# Patient Record
Sex: Male | Born: 1985 | Race: Black or African American | Hispanic: No | Marital: Single | Smoking: Never smoker
Health system: Southern US, Community
[De-identification: ages and names within clinical notes are randomized; demographics above are authoritative.]

## PROBLEM LIST (undated history)

## (undated) DIAGNOSIS — J40 Bronchitis, not specified as acute or chronic: Secondary | ICD-10-CM

## (undated) DIAGNOSIS — J45909 Unspecified asthma, uncomplicated: Secondary | ICD-10-CM

---
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
## 2005-07-30 ENCOUNTER — Emergency Department (HOSPITAL_COMMUNITY): Admission: EM | Admit: 2005-07-30 | Discharge: 2005-07-30 | Payer: Self-pay | Admitting: Emergency Medicine

## 2006-05-07 ENCOUNTER — Emergency Department (HOSPITAL_COMMUNITY): Admission: EM | Admit: 2006-05-07 | Discharge: 2006-05-07 | Payer: Self-pay | Admitting: Emergency Medicine

## 2006-07-01 ENCOUNTER — Emergency Department (HOSPITAL_COMMUNITY): Admission: EM | Admit: 2006-07-01 | Discharge: 2006-07-01 | Payer: Self-pay | Admitting: Emergency Medicine

## 2006-09-18 ENCOUNTER — Emergency Department (HOSPITAL_COMMUNITY): Admission: EM | Admit: 2006-09-18 | Discharge: 2006-09-18 | Payer: Self-pay | Admitting: Emergency Medicine

## 2006-09-19 ENCOUNTER — Emergency Department (HOSPITAL_COMMUNITY): Admission: EM | Admit: 2006-09-19 | Discharge: 2006-09-19 | Payer: Self-pay | Admitting: Emergency Medicine

## 2008-08-20 ENCOUNTER — Emergency Department (HOSPITAL_COMMUNITY): Admission: EM | Admit: 2008-08-20 | Discharge: 2008-08-21 | Payer: Self-pay | Admitting: Emergency Medicine

## 2009-08-14 ENCOUNTER — Emergency Department (HOSPITAL_COMMUNITY): Admission: EM | Admit: 2009-08-14 | Discharge: 2009-08-14 | Payer: Self-pay | Admitting: Family Medicine

## 2010-11-19 ENCOUNTER — Inpatient Hospital Stay (INDEPENDENT_AMBULATORY_CARE_PROVIDER_SITE_OTHER)
Admission: RE | Admit: 2010-11-19 | Discharge: 2010-11-19 | Disposition: A | Discharge status: Home / Self Care | Payer: Self-pay | Source: Ambulatory Visit | Attending: Family Medicine | Admitting: Family Medicine

## 2010-11-19 DIAGNOSIS — K2970 Gastritis, unspecified, without bleeding: Secondary | ICD-10-CM

## 2010-11-19 LAB — POCT I-STAT, CHEM 8
BUN: 13 mg/dL (ref 6–23)
Calcium, Ion: 1.18 mmol/L (ref 1.12–1.32)
Chloride: 106 mEq/L (ref 96–112)
Creatinine, Ser: 0.8 mg/dL (ref 0.4–1.5)
Glucose, Bld: 90 mg/dL (ref 70–99)
HCT: 50 % (ref 39.0–52.0)
Hemoglobin: 17 g/dL (ref 13.0–17.0)
Potassium: 4.2 mEq/L (ref 3.5–5.1)
Sodium: 140 mEq/L (ref 135–145)
TCO2: 23 mmol/L (ref 0–100)

## 2010-11-19 LAB — POCT URINALYSIS DIPSTICK
Bilirubin Urine: NEGATIVE
Glucose, UA: NEGATIVE mg/dL
Hgb urine dipstick: NEGATIVE
Ketones, ur: NEGATIVE mg/dL
Nitrite: NEGATIVE
Protein, ur: NEGATIVE mg/dL
Specific Gravity, Urine: 1.025 (ref 1.005–1.030)
Urobilinogen, UA: 0.2 mg/dL (ref 0.0–1.0)
pH: 6 (ref 5.0–8.0)

## 2010-11-19 LAB — LIPASE, BLOOD: Lipase: 31 U/L (ref 11–59)

## 2010-11-19 LAB — OCCULT BLOOD, POC DEVICE: Fecal Occult Bld: NEGATIVE

## 2010-12-22 LAB — GC/CHLAMYDIA PROBE AMP, GENITAL: Chlamydia, DNA Probe: NEGATIVE

## 2011-06-12 ENCOUNTER — Inpatient Hospital Stay (INDEPENDENT_AMBULATORY_CARE_PROVIDER_SITE_OTHER)
Admission: RE | Admit: 2011-06-12 | Discharge: 2011-06-12 | Disposition: A | Discharge status: Home / Self Care | Payer: Self-pay | Source: Ambulatory Visit | Attending: Family Medicine | Admitting: Family Medicine

## 2011-06-12 DIAGNOSIS — J45909 Unspecified asthma, uncomplicated: Secondary | ICD-10-CM

## 2012-07-22 ENCOUNTER — Emergency Department (HOSPITAL_COMMUNITY)
Admission: EM | Admit: 2012-07-22 | Discharge: 2012-07-22 | Disposition: A | Discharge status: Home / Self Care | Payer: Self-pay | Attending: Emergency Medicine | Admitting: Emergency Medicine

## 2012-07-22 ENCOUNTER — Encounter (HOSPITAL_COMMUNITY): Payer: Self-pay

## 2012-07-22 ENCOUNTER — Emergency Department (HOSPITAL_COMMUNITY)
Admission: EM | Admit: 2012-07-22 | Discharge: 2012-07-22 | Discharge status: Against Medical Advice | Payer: Self-pay | Attending: Emergency Medicine | Admitting: Emergency Medicine

## 2012-07-22 DIAGNOSIS — R0989 Other specified symptoms and signs involving the circulatory and respiratory systems: Secondary | ICD-10-CM | POA: Insufficient documentation

## 2012-07-22 DIAGNOSIS — J40 Bronchitis, not specified as acute or chronic: Secondary | ICD-10-CM | POA: Insufficient documentation

## 2012-07-22 DIAGNOSIS — Z79899 Other long term (current) drug therapy: Secondary | ICD-10-CM | POA: Insufficient documentation

## 2012-07-22 DIAGNOSIS — R0609 Other forms of dyspnea: Secondary | ICD-10-CM | POA: Insufficient documentation

## 2012-07-22 DIAGNOSIS — J45909 Unspecified asthma, uncomplicated: Secondary | ICD-10-CM | POA: Insufficient documentation

## 2012-07-22 DIAGNOSIS — R059 Cough, unspecified: Secondary | ICD-10-CM | POA: Insufficient documentation

## 2012-07-22 DIAGNOSIS — R05 Cough: Secondary | ICD-10-CM | POA: Insufficient documentation

## 2012-07-22 HISTORY — DX: Bronchitis, not specified as acute or chronic: J40

## 2012-07-22 HISTORY — DX: Unspecified asthma, uncomplicated: J45.909

## 2012-07-22 MED ORDER — AZITHROMYCIN 250 MG PO TABS: 250.0000 mg | ORAL_TABLET | Freq: Every day | ORAL | Status: DC

## 2012-07-22 MED ORDER — PREDNISONE 20 MG PO TABS: 40.0000 mg | ORAL_TABLET | Freq: Every day | ORAL | Status: DC

## 2012-07-22 MED ORDER — ALBUTEROL SULFATE HFA 108 (90 BASE) MCG/ACT IN AERS
2.0000 | INHALATION_SPRAY | Freq: Once | RESPIRATORY_TRACT | Status: AC
  Administered 2012-07-22: 2 via RESPIRATORY_TRACT
  Filled 2012-07-22: qty 6.7

## 2012-07-22 NOTE — ED Provider Notes (Signed)
History     CSN: [id number]  Arrival date & time 07/22/12  4098   First MD Initiated Contact with Patient 07/22/12 [phone]      Chief Complaint  Patient presents with  . Shortness of Breath    (Consider location/radiation/quality/duration/timing/severity/associated sxs/prior treatment) HPI Comments: Pt with hx of bronchitis and asthma presents with 1 week of gradual onset, gradually worsening persitent SOB and wheezing and coughing - worse at night andd asociated with productive cough.  Dnies fevers, chills n/v/swelling / rashes.  He has used MDI until gone with minmal improvmenet.  Patient is a 26 y.o. male presenting with shortness of breath. The history is provided by the patient.  Shortness of Breath  Associated symptoms include cough, shortness of breath and wheezing. Pertinent negatives include no chest pain, no fever and no sore throat.    Past Medical History  Diagnosis Date  . Asthma   . Bronchitis     History reviewed. No pertinent past surgical history.  No family history on file.  History  Substance Use Topics  . Smoking status: Never Smoker   . Smokeless tobacco: Not on file  . Alcohol Use: No      Review of Systems  Constitutional: Negative for fever.  HENT: Negative for congestion, sore throat and neck stiffness.   Eyes: Negative for pain and visual disturbance.  Respiratory: Positive for cough, shortness of breath and wheezing.   Cardiovascular: Negative for chest pain.  Gastrointestinal: Negative for nausea.  Skin: Negative for rash.    Allergies  Review of patient's allergies indicates no known allergies.  Home Medications   Current Outpatient Rx  Name  Route  Sig  Dispense  Refill  . ALBUTEROL SULFATE HFA 108 (90 BASE) MCG/ACT IN AERS   Inhalation   Inhale 2-3 puffs into the lungs every 3 (three) hours as needed. Wheezing or shortness of breath         . AZITHROMYCIN 250 MG PO TABS   Oral   Take 1 tablet (250 mg total) by mouth daily.  500mg  PO day 1, then 250mg  PO days 205   6 tablet   0   . PREDNISONE 20 MG PO TABS   Oral   Take 2 tablets (40 mg total) by mouth daily.   10 tablet   0     BP 146/75  Pulse 76  Temp 98.8 F (37.1 C) (Oral)  Resp 18  SpO2 98%  Physical Exam  Nursing note and vitals reviewed. Constitutional: He appears well-developed and well-nourished. No distress.  HENT:  Head: Normocephalic.  Mouth/Throat: No oropharyngeal exudate.       OP clear without erythema, swelling or exudate  Eyes: Conjunctivae normal are normal. No scleral icterus.  Neck: Normal range of motion. Neck supple.  Cardiovascular: Normal rate, regular rhythm, normal heart sounds and intact distal pulses.   Pulmonary/Chest: Effort normal.       Mild expiratory wheezing, no distress, no increased WOB  Lymphadenopathy:    He has no cervical adenopathy.    ED Course  Procedures (including critical care time)  Labs Reviewed - No data to display No results found.   1. Bronchitis       MDM  Well appaering with bronchitis - no fever, persistent cough, mild wheeze,   Albuterol MDI Predniesone Ziothromax        Vida Roller, MD 07/22/12 [phone]

## 2012-07-22 NOTE — ED Notes (Signed)
Please refer to downtime documentaiton for traige.

## 2012-07-22 NOTE — ED Notes (Signed)
C/o sob onset 1 week ago states he normally uses his inhaler however he has run out. Difference tonight he has run out of inhalers . C/o cough,.

## 2012-07-22 NOTE — ED Notes (Signed)
Patient presents with shortness of breath x 1 week intermittently. Patient has history of asthma and reports his albuterol inhaler is not working.  No wheezing noted upon auscultation, patient speaking in full, complete sentences, no distress noted, patient saturation 98% on room air.

## 2014-05-20 ENCOUNTER — Emergency Department (HOSPITAL_COMMUNITY)
Admission: EM | Admit: 2014-05-20 | Discharge: 2014-05-20 | Disposition: A | Discharge status: Home / Self Care | Payer: Self-pay | Attending: Emergency Medicine | Admitting: Emergency Medicine

## 2014-05-20 ENCOUNTER — Encounter (HOSPITAL_COMMUNITY): Payer: Self-pay | Admitting: Emergency Medicine

## 2014-05-20 DIAGNOSIS — Z792 Long term (current) use of antibiotics: Secondary | ICD-10-CM | POA: Insufficient documentation

## 2014-05-20 DIAGNOSIS — J4521 Mild intermittent asthma with (acute) exacerbation: Secondary | ICD-10-CM

## 2014-05-20 DIAGNOSIS — Z79899 Other long term (current) drug therapy: Secondary | ICD-10-CM | POA: Insufficient documentation

## 2014-05-20 DIAGNOSIS — J45909 Unspecified asthma, uncomplicated: Secondary | ICD-10-CM | POA: Insufficient documentation

## 2014-05-20 DIAGNOSIS — J45901 Unspecified asthma with (acute) exacerbation: Secondary | ICD-10-CM | POA: Insufficient documentation

## 2014-05-20 MED ORDER — IPRATROPIUM-ALBUTEROL 0.5-2.5 (3) MG/3ML IN SOLN
3.0000 mL | Freq: Once | RESPIRATORY_TRACT | Status: AC
  Administered 2014-05-20: 3 mL via RESPIRATORY_TRACT
  Filled 2014-05-20: qty 3

## 2014-05-20 MED ORDER — PREDNISONE 20 MG PO TABS: 40.0000 mg | ORAL_TABLET | Freq: Every day | ORAL | Status: DC

## 2014-05-20 MED ORDER — ALBUTEROL SULFATE HFA 108 (90 BASE) MCG/ACT IN AERS
2.0000 | INHALATION_SPRAY | RESPIRATORY_TRACT | Status: DC | PRN
  Administered 2014-05-20: 2 via RESPIRATORY_TRACT
  Filled 2014-05-20: qty 6.7

## 2014-05-20 NOTE — Discharge Instructions (Signed)
Asthma Attack Prevention Although there is no way to prevent asthma from starting, you can take steps to control the disease and reduce its symptoms. Learn about your asthma and how to control it. Take an active role to control your asthma by working with your health care provider to create and follow an asthma action plan. An asthma action plan guides you in:  Taking your medicines properly.  Avoiding things that set off your asthma or make your asthma worse (asthma triggers).  Tracking your level of asthma control.  Responding to worsening asthma.  Seeking emergency care when needed. To track your asthma, keep records of your symptoms, check your peak flow number using a handheld device that shows how well air moves out of your lungs (peak flow meter), and get regular asthma checkups.  WHAT ARE SOME WAYS TO PREVENT AN ASTHMA ATTACK?  Take medicines as directed by your health care provider.  Keep track of your asthma symptoms and level of control.  With your health care provider, write a detailed plan for taking medicines and managing an asthma attack. Then be sure to follow your action plan. Asthma is an ongoing condition that needs regular monitoring and treatment.  Identify and avoid asthma triggers. Many outdoor allergens and irritants (such as pollen, mold, cold air, and air pollution) can trigger asthma attacks. Find out what your asthma triggers are and take steps to avoid them.  Monitor your breathing. Learn to recognize warning signs of an attack, such as coughing, wheezing, or shortness of breath. Your lung function may decrease before you notice any signs or symptoms, so regularly measure and record your peak airflow with a home peak flow meter.  Identify and treat attacks early. If you act quickly, you are less likely to have a severe attack. You will also need less medicine to control your symptoms. When your peak flow measurements decrease and alert you to an upcoming attack,  take your medicine as instructed and immediately stop any activity that may have triggered the attack. If your symptoms do not improve, get medical help.  Pay attention to increasing quick-relief inhaler use. If you find yourself relying on your quick-relief inhaler, your asthma is not under control. See your health care provider about adjusting your treatment. WHAT CAN MAKE MY SYMPTOMS WORSE? A number of common things can set off or make your asthma symptoms worse and cause temporary increased inflammation of your airways. Keep track of your asthma symptoms for several weeks, detailing all the environmental and emotional factors that are linked with your asthma. When you have an asthma attack, go back to your asthma diary to see which factor, or combination of factors, might have contributed to it. Once you know what these factors are, you can take steps to control many of them. If you have allergies and asthma, it is important to take asthma prevention steps at home. Minimizing contact with the substance to which you are allergic will help prevent an asthma attack. Some triggers and ways to avoid these triggers are: Animal Dander:  Some people are allergic to the flakes of skin or dried saliva from animals with fur or feathers.   There is no such thing as a hypoallergenic dog or cat breed. All dogs or cats can cause allergies, even if they don't shed.  Keep these pets out of your home.  If you are not able to keep a pet outdoors, keep the pet out of your bedroom and other sleeping areas at all  times, and keep the door closed. °· Remove carpets and furniture covered with cloth from your home. If that is not possible, keep the pet away from fabric-covered furniture and carpets. °Dust Mites: °Many people with asthma are allergic to dust mites. Dust mites are tiny bugs that are found in every home in mattresses, pillows, carpets, fabric-covered furniture, bedcovers, clothes, stuffed toys, and other  fabric-covered items.  °· Cover your mattress in a special dust-proof cover. °· Cover your pillow in a special dust-proof cover, or wash the pillow each week in hot water. Water must be hotter than 130° F (54.4° C) to kill dust mites. Cold or warm water used with detergent and bleach can also be effective. °· Wash the sheets and blankets on your bed each week in hot water. °· Try not to sleep or lie on cloth-covered cushions. °· Call ahead when traveling and ask for a smoke-free hotel room. Bring your own bedding and pillows in case the hotel only supplies feather pillows and down comforters, which may contain dust mites and cause asthma symptoms. °· Remove carpets from your bedroom and those laid on concrete, if you can. °· Keep stuffed toys out of the bed, or wash the toys weekly in hot water or cooler water with detergent and bleach. °Cockroaches: °Many people with asthma are allergic to the droppings and remains of cockroaches.  °· Keep food and garbage in closed containers. Never leave food out. °· Use poison baits, traps, powders, gels, or paste (for example, boric acid). °· If a spray is used to kill cockroaches, stay out of the room until the odor goes away. °Indoor Mold: °· Fix leaky faucets, pipes, or other sources of water that have mold around them. °· Clean floors and moldy surfaces with a fungicide or diluted bleach. °· Avoid using humidifiers, vaporizers, or swamp coolers. These can spread molds through the air. °Pollen and Outdoor Mold: °· When pollen or mold spore counts are high, try to keep your windows closed. °· Stay indoors with windows closed from late morning to afternoon. Pollen and some mold spore counts are highest at that time. °· Ask your health care provider whether you need to take anti-inflammatory medicine or increase your dose of the medicine before your allergy season starts. °Other Irritants to Avoid: °· Tobacco smoke is an irritant. If you smoke, ask your health care provider how  you can quit. Ask family members to quit smoking, too. Do not allow smoking in your home or car. °· If possible, do not use a wood-burning stove, kerosene heater, or fireplace. Minimize exposure to all sources of smoke, including incense, candles, fires, and fireworks. °· Try to stay away from strong odors and sprays, such as perfume, talcum powder, hair spray, and paints. °· Decrease humidity in your home and use an indoor air cleaning device. Reduce indoor humidity to below 60%. Dehumidifiers or central air conditioners can do this. °· Decrease house dust exposure by changing furnace and air cooler filters frequently. °· Try to have someone else vacuum for you once or twice a week. Stay out of rooms while they are being vacuumed and for a short while afterward. °· If you vacuum, use a dust mask from a hardware store, a double-layered or microfilter vacuum cleaner bag, or a vacuum cleaner with a HEPA filter. °· Sulfites in foods and beverages can be irritants. Do not drink beer or wine or eat dried fruit, processed potatoes, or shrimp if they cause asthma symptoms. °· Cold   air can trigger an asthma attack. Cover your nose and mouth with a scarf on cold or windy days.  Several health conditions can make asthma more difficult to manage, including a runny nose, sinus infections, reflux disease, psychological stress, and sleep apnea. Work with your health care provider to manage these conditions.  Avoid close contact with people who have a respiratory infection such as a cold or the flu, since your asthma symptoms may get worse if you catch the infection. Wash your hands thoroughly after touching items that may have been handled by people with a respiratory infection.  Get a flu shot every year to protect against the flu virus, which often makes asthma worse for days or weeks. Also get a pneumonia shot if you have not previously had one. Unlike the flu shot, the pneumonia shot does not need to be given  yearly. Medicines:  Talk to your health care provider about whether it is safe for you to take aspirin or non-steroidal anti-inflammatory medicines (NSAIDs). In a small number of people with asthma, aspirin and NSAIDs can cause asthma attacks. These medicines must be avoided by people who have known aspirin-sensitive asthma. It is important that people with aspirin-sensitive asthma read labels of all over-the-counter medicines used to treat pain, colds, coughs, and fever.  Beta-blockers and ACE inhibitors are other medicines you should discuss with your health care provider. HOW CAN I FIND OUT WHAT I AM ALLERGIC TO? Ask your asthma health care provider about allergy skin testing or blood testing (the RAST test) to identify the allergens to which you are sensitive. If you are found to have allergies, the most important thing to do is to try to avoid exposure to any allergens that you are sensitive to as much as possible. Other treatments for allergies, such as medicines and allergy shots (immunotherapy) are available.  CAN I EXERCISE? Follow your health care provider's advice regarding asthma treatment before exercising. It is important to maintain a regular exercise program, but vigorous exercise or exercise in cold, humid, or dry environments can cause asthma attacks, especially for those people who have exercise-induced asthma. Document Released: 08/24/2009 Document Revised: 09/10/2013 Document Reviewed: 03/13/2013 [facility] Patient Information 2015 [place]. This information is not intended to replace advice given to you by your health care provider. Make sure you discuss any questions you have with your health care provider.   Emergency Department Resource Guide 1) Find a Doctor and Pay Out of Pocket Although you won't have to find out who is covered by your insurance plan, it is a good idea to ask around and get recommendations. You will then need to call the office and see if the doctor  you have chosen will accept you as a new patient and what types of options they offer for patients who are self-pay. Some doctors offer discounts or will set up payment plans for their patients who do not have insurance, but you will need to ask so you aren't surprised when you get to your appointment.  2) Contact Your Local Health Department Not all health departments have doctors that can see patients for sick visits, but many do, so it is worth a call to see if yours does. If you don't know where your local health department is, you can check in your phone book. The CDC also has a tool to help you locate your state's health department, and many state websites also have listings of all of their local health departments.  3) Find  a Walk-in Clinic If your illness is not likely to be very severe or complicated, you may want to try a walk in clinic. These are popping up all over the country in pharmacies, drugstores, and shopping centers. They're usually staffed by nurse practitioners or physician assistants that have been trained to treat common illnesses and complaints. They're usually fairly quick and inexpensive. However, if you have serious medical issues or chronic medical problems, these are probably not your best option.  No Primary Care Doctor: - Call Health Connect at  [phone] - they can help you locate a primary care doctor that  accepts your insurance, provides certain services, etc. - Physician Referral Service- [phone]  Chronic Pain Problems: Organization         Address  Phone   Notes  [facility]  [phone] Patients need to be referred by their primary care doctor.   Medication Assistance: Organization         Address  Phone   Notes  [facility]ication [facility] [address] [place], [place] [id number] [phone] --Must be a resident of [facility] -- Must have NO insurance coverage whatsoever (no Medicaid/  Medicare, etc.) -- The pt. MUST have a primary care doctor that directs their care regularly and follows them in the community   MedAssist  [phone]   [employer]  [phone]    Agencies that provide inexpensive medical care: Organization         Address  Phone   Notes  [facility]  [phone]   [facility]    [phone]   [facility] [address] [place], [place] [address] [phone]   [facility] [address], [place] [phone]   [facility]anned Parenthood    [phone]   [facility]    [phone]   [facility]  [address], [place] Phone:  [phone], Fax:  [phone] Hours of Operation:  9 am - 6 pm, M-F.  Also accepts Medicaid/Medicare and self-pay.  [facility]  [address], [place] Phone: [phone], Fax: [phone]. Hours of Operation:  8:30 am - 5:30 pm, M-F.  Also accepts Medicaid and self-pay.  [facility] [place] [address], [place] Phone: [phone]   [facility] [address], [place], [place] [phone], Ext. [phone] Mondays & Thursdays: 7-9 AM.  First 15 patients are seen on a first come, first serve basis.    Medicaid-accepting [facility] Providers:  Organization         Address  Phone   Notes  [facility] [address], [place] [phone] Also accepts self-pay patients.  [facility] [address] [place], [place]  [phone]   [facility] [address], [place] [phone]   [facility] [address] [place] [address], [place] [phone]   Renaye RakersVeita Bland [address], [place]   [phone] Only accepts [place] Access [place] patients after they have their name applied to their card.    Self-Pay (no insurance) in [facility]:  Organization         Address  Phone   Notes  Sickle Cell Patients, [facility] [address]  [place], [place] [phone]   [facility] [address] [place], [place] [phone]   [facility] [place]  [address], [place] [phone]   [facility]/Dr. Osei-Bonsu  [address], [place] or [address], [place] [phone] Phone number for both [place] and [place] locations is the same.  [facility] [address], [place] [phone]   [facility] [address] [place] or [address] [phone]   [facility] [address], [place] [phone], phone; [phone], fax Sees patients 1st and 3rd Saturday of every month.  Must not qualify for public or private insurance (i.e. Medicaid, Medicare, [place] Health Choice, Veterans' Benefits)  Household income should be no more than 200% of the poverty level The clinic cannot treat you if you are pregnant or think you are pregnant  Sexually transmitted diseases are not treated at the clinic.    Dental Care: Organization         Address  Phone  Notes  [facility] Department of [facility] [address] [place], [place] [phone] Accepts children up to age 82 who are enrolled in [place] or [place] Health Choice; pregnant women with a Medicaid card; and children who have applied for Medicaid or [place] Health Choice, but were declined, whose parents can pay a reduced fee at time of service.  [facility] Department of [facility]  [address], [place] [phone] Accepts children up to age 62 who are enrolled in [place] or [place] Health Choice; pregnant women with a Medicaid card; and children who have applied for Medicaid or [place] Health Choice,  but were declined, whose parents can pay a reduced fee at time of service.  [facility]  [address] [place], [place] [phone] Patients are seen by appointment only. Walk-ins are not accepted. [facility] will see patients 38 years of age and older. Monday - Tuesday (8am-5pm) Most Wednesdays (8:30-5pm) $30 per visit, cash only  [facility] Adult Dental Access PROGRAM  [address], [facility] [phone] Patients are seen by appointment only. Walk-ins are not accepted. [facility] will see patients 67 years of age and older. One Wednesday Evening (Monthly: Volunteer Based).  $30 per visit, cash only  [employer]  [phone] for adults; Children under age 89, call Graduate Pediatric Dentistry at [phone]. Children aged 32-14, please call [phone] to request a pediatric application.  Dental services are provided in all areas of dental care including fillings, crowns and bridges, complete and partial dentures, implants, gum treatment, root canals, and extractions. Preventive care is also provided. Treatment is provided to both adults and children. Patients are selected via a lottery and there is often a waiting list.   [facility] [address] [place]  [phone] [email or web address]   [facility] [address] [place], [place] [phone], Ext. [phone] Second and Fourth Thursday of each month, opens at 6:30 AM; Clinic ends at 9 AM.  Patients are seen on a first-come first-served basis, and a limited number are seen during each clinic.   [facility]  [address] [place], [place] [phone]   Eligibility Requirements You must have lived in [place], [place], or [place]  counties for at least the last three months.   You cannot be eligible for state or federal sponsored [place], including [employer], [place], or [employer].   You generally  cannot be eligible for healthcare insurance through your employer.    How to apply: Eligibility screenings are held every Tuesday and Wednesday afternoon from 1:00 pm until 4:00 pm. You do not need an appointment for the interview!  [facility] [address], [place], [place] [phone]   [facility]  [phone]   [facility] Health Department  [phone]   [facility] Health Department  [phone]    Behavioral Health Resources in the Community: Intensive Outpatient Programs Organization         Address  Phone  Notes  [facility] [address], [place], [place] [phone]   [facility] Outpatient [address], [place], [place] [phone]   [facility] [address], [place], [place]  [phone]   [facility] Mental Health [address]  [place], [place] [phone] or [phone]   Substance Abuse Resources Organization         Address  Phone  Notes  [facility] Drug Services  [phone]   [facility]  [phone]   [facility] [place]  [phone]   Floydene Flock  [phone]   Residential & Outpatient Substance Abuse Program  [phone]   Psychological Services Organization         Address  Phone  Notes  [facility]  [phone]   [facility]  [phone]   [facility] [address] [place] [phone] or [phone]    Mobile Crisis Teams Organization         Address  Phone  Notes  [facility], Mobile Crisis Care Unit  [phone]   [facility]  [address] [place], [place] [phone]   Doristine Locks [address] [place] [phone]    Self-Help/Support Groups Organization         Address  Phone             Notes  [facility] [place] - variety of support groups  [phone]- I7437963 Call for more  information  Narcotics Anonymous (NA), [facility] [address], [employer] [place]  2 meetings at this location   Statistician         Address  Phone  Notes  [facility] [address],    [place]  [phone]   [facility]  [address] [place] [address], [place], [place] [phone]   [facility] [address] [place], [place] [phone] Admissions: 8am-3pm M-F  [facility] [address],    [place], [place] [phone]   [facility] [address] [place], [place], [place] [phone]   [facility] [address],  [place], [place] [phone]   [facility] - Intensive Outpatient [address], [place], [place] [phone]   [facility] [address] [place]  [place], [place] [phone] or [phone]   [facility] ([facility]) [address] [place], [place] [phone] Accepts Medicaid  [facility] [address]  [place] [phone] Substance Abuse/Addiction Treatment   [facility] Resources Organization         Address  Phone  Notes  Estate manager/land agent Services  (  [phone]   Angie Fava, PhD [address] [place], [place]   [phone] or [phone]   [facility] Behavioral   [address] [place], [place] [phone]   [facility] Recovery [address], [place], [place] [phone] Insurance/Medicaid/sponsorship through [facility] [address]                                    [place], [place] [phone] Therapy/tele-psych/case  [facility] [address], [place] [phone]    Dr. Lolly Mustache  [phone]   [facility]  [employer] [facility] Dept. 1) [address], [place] 2) [address], [place] 3)  [address], [place] [phone]  [phone]   [facility] Child Abuse Hotline (787) 103-5261 or [phone] (After Hours)

## 2014-05-20 NOTE — ED Provider Notes (Signed)
CSN: [id number]     Arrival date & time 05/20/14  1458 History  This chart was scribed for non-physician practitioner, Fayrene Helper, PA-C working with Doug Sou, MD by Luisa Dago, ED scribe. This patient was seen in room TR08C/TR08C and the patient's care was started at 4:52 PM.    Chief Complaint  Patient presents with  . Asthma   The history is provided by the patient. No language interpreter was used.   HPI Comments: Justin Villegas is a 28 y.o. male with a hx of asthma presents to the Emergency Department requesting a medication refill on for his  Albuterol inhaler. Pt states that he ran out of his medication about a month ago and has been using his niece's inhaler on and off. Pt states that this morning he started wheezing so he decided to come in for a refill. Justin Villegas does not have a PCP. He states that he can go 8 months without any symptoms. He is also complaining of associated rhinorrhea and sneezing. No hx of intubations or ICU stays. Denies fever, chills, or sore throat.   Past Medical History  Diagnosis Date  . Asthma   . Bronchitis    History reviewed. No pertinent past surgical history. No family history on file. History  Substance Use Topics  . Smoking status: Never Smoker   . Smokeless tobacco: Not on file  . Alcohol Use: No    Review of Systems  Constitutional: Negative for fever.  Eyes: Negative for visual disturbance.  Respiratory: Positive for wheezing. Negative for shortness of breath.   Cardiovascular: Negative for chest pain, palpitations and leg swelling.  Gastrointestinal: Negative for nausea, vomiting and abdominal pain.  Neurological: Negative for dizziness, light-headedness and headaches.      Allergies  Review of patient's allergies indicates no known allergies.  Home Medications   Prior to Admission medications   Medication Sig Start Date End Date Taking? Authorizing Provider  albuterol (PROVENTIL HFA;VENTOLIN HFA) 108 (90 BASE)  MCG/ACT inhaler Inhale 2-3 puffs into the lungs every 3 (three) hours as needed. Wheezing or shortness of breath    Historical Provider, MD  azithromycin (ZITHROMAX Z-PAK) 250 MG tablet Take 1 tablet (250 mg total) by mouth daily.  PO day 1, then  PO days 205 07/22/12   Vida Roller, MD  predniSONE (DELTASONE) 20 MG tablet Take 2 tablets (40 mg total) by mouth daily. 07/22/12   Vida Roller, MD   BP 146/75  Pulse 86  Temp(Src) 98 F (36.7 C) (Oral)  Resp 20  SpO2 98%  Physical Exam  Nursing note and vitals reviewed. Constitutional: He is oriented to person, place, and time. He appears well-developed and well-nourished. No distress.  HENT:  Head: Normocephalic and atraumatic.  Right Ear: External ear normal.  Left Ear: External ear normal.  Nose: Nose normal.  Mouth/Throat: Oropharynx is clear and moist. No oropharyngeal exudate.  Eyes: Conjunctivae and EOM are normal. Pupils are equal, round, and reactive to light.  Neck: Normal range of motion. Neck supple. No thyromegaly present.  Cardiovascular: Normal rate, regular rhythm and normal heart sounds.  Exam reveals no gallop and no friction rub.   No murmur heard. Pulmonary/Chest: Effort normal and breath sounds normal. No respiratory distress. He has no wheezes.  Musculoskeletal: Normal range of motion.  Lymphadenopathy:    He has no cervical adenopathy.  Neurological: He is alert and oriented to person, place, and time.  Skin: Skin is warm and dry.  Psychiatric:  He has a normal mood and affect. His behavior is normal.    ED Course  Procedures (including critical care time)  DIAGNOSTIC STUDIES: Oxygen Saturation is 98% on RA, normal by my interpretation.    COORDINATION OF CARE: 4:59 PM- able to ambulate while maintaning adequate O2.  Stable for discharge.  No active wheezing.  sxs likely trigger due to URI.  Steroid course provided.  Rescue inhaler given.  Pt advised of plan for treatment and pt agrees.  Labs  Review Labs Reviewed - No data to display  Imaging Review No results found.   EKG Interpretation None      MDM   Final diagnoses:  Asthma exacerbation attacks, mild intermittent   BP 146/75  Pulse 86  Temp(Src) 98 F (36.7 C) (Oral)  Resp 20  SpO2 98%   I personally performed the services described in this documentation, which was scribed in my presence. The recorded information has been reviewed and is accurate.    Fayrene Helper, PA-C 05/20/14 1715

## 2014-05-20 NOTE — ED Notes (Signed)
Pt reports he has been out of inhaler x months. Pt states today he started noticing wheezing while working today. Denies SOB, pt speaks in complete sentences, NAD. Lungs diminished in bases bilaterally, exp wheezing noted to right lower lung. NAD.

## 2014-05-21 NOTE — ED Provider Notes (Signed)
Medical screening examination/treatment/procedure(s) were performed by non-physician practitioner and as supervising physician I was immediately available for consultation/collaboration.   EKG Interpretation None       Doug Sou, MD 05/21/14 [phone]

## 2014-10-27 ENCOUNTER — Ambulatory Visit: Payer: Self-pay | Attending: Internal Medicine | Admitting: Internal Medicine

## 2014-10-27 ENCOUNTER — Encounter: Payer: Self-pay | Admitting: Internal Medicine

## 2014-10-27 VITALS — BP 125/78 | HR 64 | Temp 98.8°F | Resp 16 | Ht 73.0 in | Wt 319.0 lb

## 2014-10-27 DIAGNOSIS — Z23 Encounter for immunization: Secondary | ICD-10-CM

## 2014-10-27 DIAGNOSIS — Z7952 Long term (current) use of systemic steroids: Secondary | ICD-10-CM | POA: Insufficient documentation

## 2014-10-27 DIAGNOSIS — Z833 Family history of diabetes mellitus: Secondary | ICD-10-CM

## 2014-10-27 DIAGNOSIS — Z79899 Other long term (current) drug therapy: Secondary | ICD-10-CM | POA: Insufficient documentation

## 2014-10-27 DIAGNOSIS — E669 Obesity, unspecified: Secondary | ICD-10-CM

## 2014-10-27 DIAGNOSIS — J4520 Mild intermittent asthma, uncomplicated: Secondary | ICD-10-CM

## 2014-10-27 LAB — GLUCOSE, POCT (MANUAL RESULT ENTRY): POC GLUCOSE: 92 mg/dL (ref 70–99)

## 2014-10-27 MED ORDER — ALBUTEROL SULFATE HFA 108 (90 BASE) MCG/ACT IN AERS: 2.0000 | INHALATION_SPRAY | RESPIRATORY_TRACT | Status: AC | PRN

## 2014-10-27 NOTE — Progress Notes (Signed)
Patient ID: Justin Villegas, male   DOB: 1986-07-04, 29 y.o.   MRN: [id number]  [id number]  [id number]  DOB - 1986-07-04  CC:  Chief Complaint  Patient presents with  . Establish Care       HPI: Justin Villegas is a 29 y.o. male here today to establish medical care. He has a past medical history of asthma.  He presents today for inhaler refills.  He reports that he has only been on albuterol in the past.  He has been out of his inhaler for the past 6 months.  He now has wheezing, SOB, and nighttime SOB once every two weeks.  He denies tobacco, drug, and alcohol use.  Patient reports that he has several family members that are diabetic and he would like testing.    No Known Allergies Past Medical History  Diagnosis Date  . Asthma   . Bronchitis    Current Outpatient Prescriptions on File Prior to Visit  Medication Sig Dispense Refill  . albuterol (PROVENTIL HFA;VENTOLIN HFA) 108 (90 BASE) MCG/ACT inhaler Inhale 2-3 puffs into the lungs every 3 (three) hours as needed. Wheezing or shortness of breath    . azithromycin (ZITHROMAX Z-PAK) 250 MG tablet Take 1 tablet (250 mg total) by mouth daily. 500mg  PO day 1, then 250mg  PO days 205 (Patient not taking: Reported on 10/27/2014) 6 tablet 0  . predniSONE (DELTASONE) 20 MG tablet Take 2 tablets (40 mg total) by mouth daily. (Patient not taking: Reported on 10/27/2014) 10 tablet 0   No current facility-administered medications on file prior to visit.   History reviewed. No pertinent family history. History   Social History  . Marital Status: Single    Spouse Name: N/A    Number of Children: N/A  . Years of Education: N/A   Occupational History  . Not on file.   Social History Main Topics  . Smoking status: Never Smoker   . Smokeless tobacco: Not on file  . Alcohol Use: No  . Drug Use: No  . Sexual Activity: Not on file   Other Topics Concern  . Not on file   Social History Narrative    Review of Systems  Respiratory:  Positive for shortness of breath and wheezing.   All other systems reviewed and are negative.     Objective:   Filed Vitals:   10/27/14 0915  BP: 125/78  Pulse: 64  Temp: 98.8 F (37.1 C)  Resp: 16    Physical Exam  Constitutional: He is oriented to person, place, and time.  Cardiovascular: Normal rate, regular rhythm and normal heart sounds.   Pulmonary/Chest: Effort normal and breath sounds normal.  Abdominal: Soft. Bowel sounds are normal.  Musculoskeletal: He exhibits no edema.  Neurological: He is alert and oriented to person, place, and time.  Skin: Skin is warm and dry.     Lab Results  Component Value Date   HGB 17.0 11/19/2010   HCT 50.0 11/19/2010   Lab Results  Component Value Date   CREATININE 0.8 11/19/2010   BUN 13 11/19/2010   NA 140 11/19/2010   K 4.2 11/19/2010   CL 106 11/19/2010    No results found for: HGBA1C Lipid Panel  No results found for: CHOL, TRIG, HDL, CHOLHDL, VLDL, LDLCALC     Assessment and plan:   Justin Villegas was seen today for establish care.  Diagnoses and associated orders for this visit:  Asthma, mild intermittent, uncomplicated - albuterol (PROVENTIL HFA;VENTOLIN HFA) 108 (90 BASE)  MCG/ACT inhaler; Inhale 2 puffs into the lungs every 4 (four) hours as needed. Wheezing or shortness of breath  Obesity Weight loss discussed at length and its complications to health.  Patient will loss 10 months by next visit in 3 months.  Diet and exercise discussed as well as calorie intake.  Family history of diabetes mellitus - POCT glucose (manual entry) - POCT glycosylated hemoglobin (Hb A1C)  Need for prophylactic vaccination and inoculation against influenza - Flu Vaccine QUAD 36+ mos IM   Return if symptoms worsen or fail to improve.    Holland Commons, NP-C [facility] [phone] 10/27/2014, 9:24 AM

## 2014-10-27 NOTE — Progress Notes (Signed)
Pt is here today to establish care. Pt has a history of asthma. Pt would like an inhaler.

## 2014-10-27 NOTE — Patient Instructions (Signed)

## 2016-01-26 ENCOUNTER — Ambulatory Visit: Payer: Self-pay | Admitting: Internal Medicine

## 2017-01-09 ENCOUNTER — Emergency Department (HOSPITAL_COMMUNITY): Payer: Self-pay

## 2017-01-09 ENCOUNTER — Encounter (HOSPITAL_COMMUNITY): Payer: Self-pay

## 2017-01-09 ENCOUNTER — Emergency Department (HOSPITAL_COMMUNITY)
Admission: EM | Admit: 2017-01-09 | Discharge: 2017-01-09 | Disposition: A | Discharge status: Home / Self Care | Payer: Self-pay | Attending: Emergency Medicine | Admitting: Emergency Medicine

## 2017-01-09 DIAGNOSIS — J45909 Unspecified asthma, uncomplicated: Secondary | ICD-10-CM | POA: Insufficient documentation

## 2017-01-09 DIAGNOSIS — W228XXA Striking against or struck by other objects, initial encounter: Secondary | ICD-10-CM | POA: Insufficient documentation

## 2017-01-09 DIAGNOSIS — Y929 Unspecified place or not applicable: Secondary | ICD-10-CM | POA: Insufficient documentation

## 2017-01-09 DIAGNOSIS — Y999 Unspecified external cause status: Secondary | ICD-10-CM | POA: Insufficient documentation

## 2017-01-09 DIAGNOSIS — S62346A Nondisplaced fracture of base of fifth metacarpal bone, right hand, initial encounter for closed fracture: Secondary | ICD-10-CM | POA: Insufficient documentation

## 2017-01-09 DIAGNOSIS — Y9389 Activity, other specified: Secondary | ICD-10-CM | POA: Insufficient documentation

## 2017-01-09 DIAGNOSIS — S62306A Unspecified fracture of fifth metacarpal bone, right hand, initial encounter for closed fracture: Secondary | ICD-10-CM

## 2017-01-09 NOTE — Progress Notes (Signed)
Orthopedic Tech Progress Note Patient Details:  Justin Villegas 11/08/1985 [id number]  Ortho Devices Type of Ortho Device: Ulna gutter splint, Arm sling Ortho Device/Splint Location: applied ulna gutter splint (short arm splint) to pt right hand/arm.  Pt tolerated well.  Provided and applied arm sling to pt right arm for support.  Right hand/arm.  Ortho Device/Splint Interventions: Application, Adjustment   Alvina Chou 01/09/2017, 11:40 AM

## 2017-01-09 NOTE — ED Provider Notes (Signed)
[facility]-EMERGENCY DEPT Provider Note   CSN: [id number] Arrival date & [id number]/23/18  4098  By signing my name below, I, Marnette Burgess Long, attest that this documentation has been prepared under the direction and in the presence of Ponciano Ort [place], [place]. Electronically Signed: Marnette Burgess Long, Scribe. 01/09/2017. 10:26 AM.   History   Chief Complaint Chief Complaint  Patient presents with  . Hand Injury   The history is provided by the patient and medical records. No language interpreter was used.    HPI Comments:  Justin Villegas is a 31 y.o. male with a PMHx of Asthma and Bronchitis, who presents to the Emergency Department complaining of sudden onset, constant, 8/10 right hand pain beginning two days ago. Pt reports "playing around" two mornings ago, when he struck the right hand on "something". Pt has an associated symptom of swelling to the area. He has tried Ibuprofen at home with minimal relief. He states direct pressure and palpation exacerbate his pain. Pt denies fever and any other injuries at this time.   Past Medical History:  Diagnosis Date  . Asthma   . Bronchitis    There are no active problems to display for this patient.  History reviewed. No pertinent surgical history.  Home Medications    Prior to Admission medications   Medication Sig Start Date End Date Taking? Authorizing Provider  albuterol (PROVENTIL HFA;VENTOLIN HFA) 108 (90 BASE) MCG/ACT inhaler Inhale 2 puffs into the lungs every 4 (four) hours as needed. Wheezing or shortness of breath 10/27/14   Ambrose Finland, NP   Family History History reviewed. No pertinent family history.  Social History Social History  Substance Use Topics  . Smoking status: Never Smoker  . Smokeless tobacco: Never Used  . Alcohol use No   Allergies   Patient has no known allergies.   Review of Systems Review of Systems  Constitutional: Negative for fever.  Musculoskeletal: Positive for joint swelling and myalgias.    All other systems reviewed and are negative.   Physical Exam Updated Vital Signs BP (!) 154/84 (BP Location: Left Arm)   Pulse 71   Temp 98 F (36.7 C) (Oral)   Resp 19   SpO2 96%   Physical Exam  Constitutional: He is oriented to person, place, and time. He appears well-developed and well-nourished.  HENT:  Head: Normocephalic.  Eyes: Conjunctivae are normal.  Cardiovascular: Normal rate.   Pulmonary/Chest: Effort normal.  Abdominal: He exhibits no distension.  Musculoskeletal: Normal range of motion.  Swollen, tender over his lateral right hand below the pinkie. Decreased ROM, neurovascularly and sensory intact   Neurological: He is alert and oriented to person, place, and time.  Skin: Skin is warm and dry.  Psychiatric: He has a normal mood and affect.  Nursing note and vitals reviewed.   ED Treatments / Results  DIAGNOSTIC STUDIES:  Oxygen Saturation is 96% on RA, adequate by my interpretation.    COORDINATION OF CARE:  10:24 AM Discussed treatment plan with pt at bedside including XR of right hand with a splint and pt agreed to plan.  Labs (all labs ordered are listed, but only abnormal results are displayed) Labs Reviewed - No data to display  EKG  EKG Interpretation None       Radiology Dg Hand Complete Right  Result Date: 01/09/2017 CLINICAL DATA:  Pain after hitting a wall EXAM: RIGHT HAND - COMPLETE 3+ VIEW COMPARISON:  None. FINDINGS: Frontal, oblique, and lateral views were obtained. There  is a fracture of the distal fifth metacarpal with volar angulation distally. No other fracture. There is soft tissue swelling medially. There is no dislocation. Joint spaces appear within normal limits. No erosive change. IMPRESSION: Soft tissue swelling, most marked medially. Fracture distal fifth metacarpal with volar angulation distally. No other fracture. No dislocation. No evident arthropathy. Electronically Signed   By: Bretta Bang III M.D.   On:  01/09/2017 10:00    Procedures Procedures (including critical care time)  Medications Ordered in ED Medications - No data to display   Initial Impression / Assessment and Plan / ED Course  I have reviewed the triage vital signs and the nursing notes.  Pertinent labs & imaging results that were available during my care of the patient were reviewed by me and considered in my medical decision making (see chart for details).      Patient X-Ray Impression: Soft tissue swelling, most marked medially. Fracture distal fifth metacarpal with volar angulation distally. No other fracture. No dislocation. No evident arthropathy.  Pt advised to follow up with orthopedics. Patient given splint while in ED, conservative therapy recommended and discussed. Patient will be discharged home & is agreeable with above plan. Returns precautions discussed. Pt appears safe for discharge.   Final Clinical Impressions(s) / ED Diagnoses   Final diagnoses:  Closed nondisplaced fracture of fifth metacarpal bone of right hand, unspecified portion of metacarpal, initial encounter    New Prescriptions Discharge Medication List as of 01/09/2017 10:55 AM      An After Visit Summary was printed and given to the patient.  I personally performed the services in this documentation, which was scribed in my presence.  The recorded information has been reviewed and considered.   Barnet Pall.     Lonia Skinner [place], PA-C 01/09/17 1526    Gerhard Munch, MD 01/12/17 [phone]

## 2017-01-09 NOTE — ED Triage Notes (Signed)
Pt states he was playing around on Saturday and hurt his right hand. Swelling noted. Limited ROM. Strong radial pulse noted.

## 2017-01-09 NOTE — Discharge Instructions (Signed)
Return if any problems.

## 2018-05-09 ENCOUNTER — Encounter (HOSPITAL_COMMUNITY): Payer: Self-pay | Admitting: *Deleted

## 2018-05-09 ENCOUNTER — Emergency Department (HOSPITAL_COMMUNITY): Payer: Self-pay

## 2018-05-09 ENCOUNTER — Emergency Department (HOSPITAL_COMMUNITY)
Admission: EM | Admit: 2018-05-09 | Discharge: 2018-05-09 | Discharge status: Against Medical Advice | Payer: Self-pay | Attending: Emergency Medicine | Admitting: Emergency Medicine

## 2018-05-09 DIAGNOSIS — Z5320 Procedure and treatment not carried out because of patient's decision for unspecified reasons: Secondary | ICD-10-CM

## 2018-05-09 DIAGNOSIS — Z79899 Other long term (current) drug therapy: Secondary | ICD-10-CM | POA: Insufficient documentation

## 2018-05-09 DIAGNOSIS — R51 Headache: Secondary | ICD-10-CM | POA: Insufficient documentation

## 2018-05-09 DIAGNOSIS — R519 Headache, unspecified: Secondary | ICD-10-CM

## 2018-05-09 DIAGNOSIS — J45909 Unspecified asthma, uncomplicated: Secondary | ICD-10-CM | POA: Insufficient documentation

## 2018-05-09 DIAGNOSIS — Z5329 Procedure and treatment not carried out because of patient's decision for other reasons: Secondary | ICD-10-CM

## 2018-05-09 NOTE — ED Triage Notes (Signed)
Pt in c/o sudden headache that felt like something was stabbing him last night, since that time pain has lessened but states he doesn't feel right

## 2018-05-09 NOTE — Discharge Instructions (Signed)
You were seen here today for headache.  Your CT scan was reassuring.  I discussed with you I have a concern for a bleed in your brain and you would need to be evaluated by a lumbar puncture as the next step. You declined  this. You were offered further imaging of your head with a different type of CT scan as well but declined this test at this time.  Leaving against medical advice can lead to but is not limited to, death, permanent disability, or severe pain. Please return as soon as possible for further evaluation.

## 2018-05-09 NOTE — ED Provider Notes (Signed)
[facility] Provider Note   CSN: [id number] Arrival date & time: 05/09/18  1116     History   Chief Complaint Chief Complaint  Patient presents with  . Headache    HPI Justin Villegas is a 32 y.o. male with no significant past medical history presents emergency department today for headache.  Patient reports he was sitting on his counts yesterday watching TV when he had the sudden onset of a sharp, stabbing headache in his right temple that he rates as a 15/10.  Towards the pain lasted as the most severe pain of his life for approximately 5 minutes before becoming slightly better.  He reports that the pain persisted throughout the night despite Advil and BC powder.  He reports upon awakening his pain had improved and was intermittent throughout the day.  He reports that after waiting in the waiting room his pain has now subsided.  He denies any head trauma, neck pain, visual changes, diplopia, facial droop, numbness/tingling/weakness of the extremities, photophobia, phonophobia, nausea/vomiting, tinnitus, difficulty with speech, vertigo, chest pain, shortness of breath, or other symptoms related to this.  He denies any cocaine or IV drug use.  He denies prior history of headaches.  No personal or family history of migraines.  He denies jaw claudication.  He denies anyone with similar symptoms.   HPI  Past Medical History:  Diagnosis Date  . Asthma   . Bronchitis     There are no active problems to display for this patient.   History reviewed. No pertinent surgical history.      Home Medications    Prior to Admission medications   Medication Sig Start Date End Date Taking? Authorizing Provider  albuterol (PROVENTIL HFA;VENTOLIN HFA) 108 (90 BASE) MCG/ACT inhaler Inhale 2 puffs into the lungs every 4 (four) hours as needed. Wheezing or shortness of breath 10/27/14  Yes Ambrose FinlandKeck, Valerie A, NP  Aspirin-Salicylamide-Caffeine (BC FAST PAIN RELIEF)  650-195-33.3 MG PACK Take 1 packet by mouth 2 (two) times daily as needed (pain).   Yes [provider]  ibuprofen (ADVIL,MOTRIN) 200 MG tablet Take 400 mg by mouth every 6 (six) hours as needed for headache.   Yes [provider]    Family History History reviewed. No pertinent family history.  Social History Social History   Tobacco Use  . Smoking status: Never Smoker  . Smokeless tobacco: Never Used  Substance Use Topics  . Alcohol use: No  . Drug use: No     Allergies   Patient has no known allergies.   Review of Systems Review of Systems  All other systems reviewed and are negative.    Physical Exam Updated Vital Signs BP (!) 162/94   Pulse (!) 59   Temp 98 F (36.7 C) (Oral)   Resp 16   SpO2 100%   Physical Exam  Constitutional: He is oriented to person, place, and time. He appears well-developed and well-nourished.  HENT:  Head: Normocephalic and atraumatic.  Right Ear: External ear normal.  Left Ear: External ear normal.  Nose: Nose normal.  Mouth/Throat: Uvula is midline, oropharynx is clear and moist and mucous membranes are normal. No tonsillar exudate.  No scalp TTP  Eyes: Pupils are equal, round, and reactive to light. Right eye exhibits no discharge. Left eye exhibits no discharge. No scleral icterus.  Neck: Trachea normal. Neck supple. No spinous process tenderness present. No neck rigidity. Normal range of motion present.  No nuchal rigidity or  meningismus. No carotid bruit.   Cardiovascular: Normal rate, regular rhythm and intact distal pulses.  No murmur heard. Pulses:      Radial pulses are 2+ on the right side, and 2+ on the left side.       Dorsalis pedis pulses are 2+ on the right side, and 2+ on the left side.       Posterior tibial pulses are 2+ on the right side, and 2+ on the left side.  No lower extremity swelling or edema. Calves symmetric in size bilaterally.  Pulmonary/Chest: Effort normal and breath sounds  normal. He exhibits no tenderness.  Abdominal: Soft. Bowel sounds are normal. There is no tenderness. There is no rebound and no guarding.  Musculoskeletal: He exhibits no edema.  Lymphadenopathy:    He has no cervical adenopathy.  Neurological: He is alert and oriented to person, place, and time. He has normal strength. He is not disoriented. No cranial nerve deficit or sensory deficit. He displays a negative Romberg sign. Coordination and gait normal. GCS eye subscore is 4. GCS verbal subscore is 5. GCS motor subscore is 6.  Mental Status:  Alert, oriented, thought content appropriate, able to give a coherent history. Speech fluent without evidence of aphasia. Able to follow 2 step commands without difficulty.  Cranial Nerves:  II:  Peripheral visual fields grossly normal, pupils equal, round, reactive to light III,IV, VI: ptosis not present, extra-ocular motions intact bilaterally  V,VII: smile symmetric, eyebrows raise symmetric, facial light touch sensation equal VIII: hearing grossly normal to voice  X: uvula elevates symmetrically  XI: bilateral shoulder shrug symmetric and strong XII: midline tongue extension without fassiculations Motor:  Normal tone. 5/5 in upper and lower extremities bilaterally including strong and equal grip strength and dorsiflexion/plantar flexion Sensory: Sensation intact to light touch in all extremities. Negative Romberg.  Deep Tendon Reflexes: 2+ and symmetric in the biceps and patella Cerebellar: normal finger-to-nose with bilateral upper extremities. Normal heel-to -shin balance bilaterally of the lower extremity. No pronator drift.  Gait: normal gait and balance CV: distal pulses palpable throughout   Skin: Skin is warm and dry. Capillary refill takes less than 2 seconds. No rash noted. Rash is not vesicular. He is not diaphoretic.  No vesicular-like rash in the V1 distribution  Psychiatric: He has a normal mood and affect.  Nursing note and vitals  reviewed.    ED Treatments / Results  Labs (all labs ordered are listed, but only abnormal results are displayed) Labs Reviewed - No data to display  EKG None  Radiology Ct Head Wo Contrast  Result Date: 05/09/2018 CLINICAL DATA:  32 year old male with sudden onset headache last night. Worst headache of life. EXAM: CT HEAD WITHOUT CONTRAST TECHNIQUE: Contiguous axial images were obtained from the base of the skull through the vertex without intravenous contrast. COMPARISON:  None. FINDINGS: Brain: No midline shift, ventriculomegaly, mass effect, evidence of mass lesion, intracranial hemorrhage or evidence of cortically based acute infarction. Gray-white matter differentiation is within normal limits throughout the brain. Normal cerebral volume. Vascular: No suspicious intracranial vascular hyperdensity. Skull: Negative. Sinuses/Orbits: Minimal paranasal sinus mucosal thickening. The visible tympanic cavities and mastoids are clear. Other: Large body habitus. Otherwise negative orbit and scalp soft tissues. IMPRESSION: Negative noncontrast Head CT. Electronically Signed   By: Odessa Fleming M.D.   On: 05/09/2018 12:07    Procedures Procedures (including critical care time)  Medications Ordered in ED Medications - No data to display   Initial Impression / Assessment  and Plan / ED Course  I have reviewed the triage vital signs and the nursing notes.  Pertinent labs & imaging results that were available during my care of the patient were reviewed by me and considered in my medical decision making (see chart for details).     32 y.o. male with sudden onset of worst headache of his life yesterday at 945 yesterday evening. He denies any head trauma, neck pain, visual changes, diplopia, facial droop, numbness/tingling/weakness of the extremities, photophobia, phonophobia, nausea/vomiting, tinnitus, difficulty with speech, vertigo, chest pain, shortness of breath, or other symptoms related to this.   No history of headaches or migraines.  He denies fever or neck stiffness.  No meningeal signs on exam.  He denies any neck pain is without carotid bruit.  No visual changes.  Do not suspect dissection.  Exam is not consistent with temporal arteritis.  He has a normal neurologic exam.  CT noncontrast done in triage unremarkable.  Patient's history is concerning for subarachnoid hemorrhage.  I discussed this with my attending, Dr. Gwenlyn FudgeGoldstein who is in agreement.  Will perform LP.  Discussed with the patient my concern that he has a subarachnoid hemorrhage/bleeding within the skull as well as the potential risks of this.  Discussed with him that the next step would be performing LP.  Patient declines at this time.  Discussed with him the risks and benefits and he states understanding.  He understands that if this is diagnosed and he goes home this can lead to permanent disability, severe pain or even death.  Patient was offered CTA as alternative and declines.  He states he is ready to home and does not want to wait.  We discussed the nature and purpose, risks and benefits, as well as, the alternatives of treatment. Time was given to allow the opportunity to ask questions and consider their options, and after the discussion, the patient decided to refuse the offerred treatment. The patient was informed that refusal could lead to, but was not limited to, death, permanent disability, or severe pain. If present, I asked the relatives or significant others to dissuade them without success. Prior to refusing, I determined that the patient had the capacity to make their decision and understood the consequences of that decision. After refusal, I made every reasonable opportunity to treat them to the best of my ability.  The patient was notified that they may return to the emergency department at any time for further treatment.    Final Clinical Impressions(s) / ED Diagnoses   Final diagnoses:  Bad headache  Left  against medical advice    ED Discharge Orders    None       Princella PellegriniMaczis, Bakary Bramer M, PA-C 05/09/18 2013    Pricilla LovelessGoldston, Scott, MD 05/09/18 2030

## 2020-03-06 IMAGING — CT CT HEAD W/O CM
4 series · 16 of 47 positions shown, 18 images · non-contrast
Comparison: None.

CLINICAL DATA: 31-year-old male with sudden onset headache last
night. Worst headache of life.

EXAM:
CT HEAD WITHOUT CONTRAST
TECHNIQUE: Contiguous axial images were obtained from the base of the skull
through the vertex without intravenous contrast.

[Series 3: head without · axial · non-contrast · 0.49mm/px · z∈[-124,-4]mm · 7 of 34 slices shown, 9 images]
[im 5/34  brain]
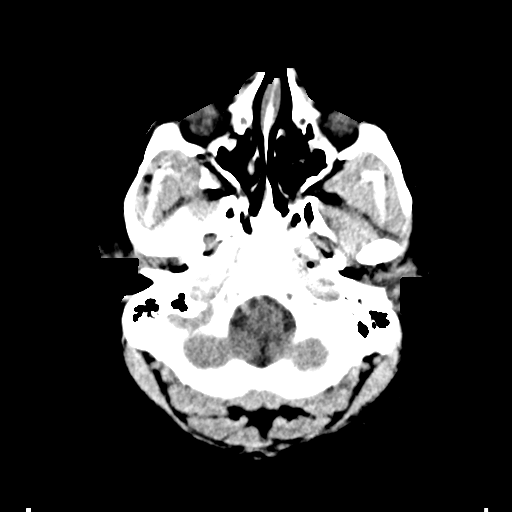
[im 5/34  bone]
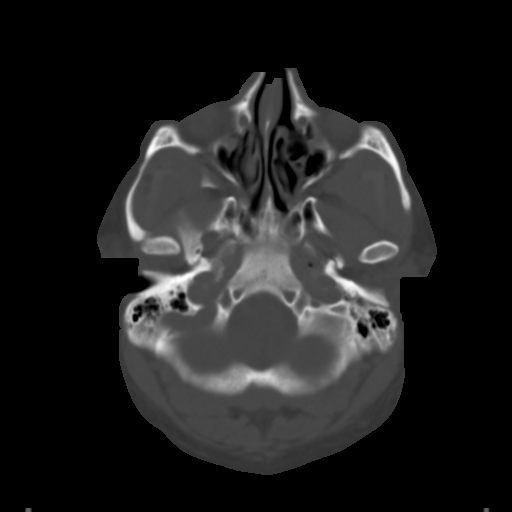
[im 9/34  brain]
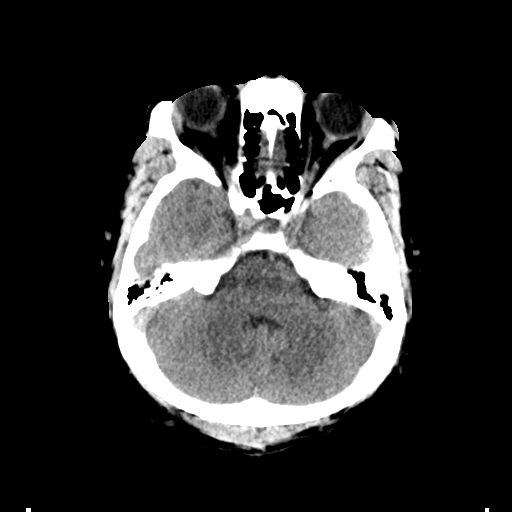
[im 13/34  brain]
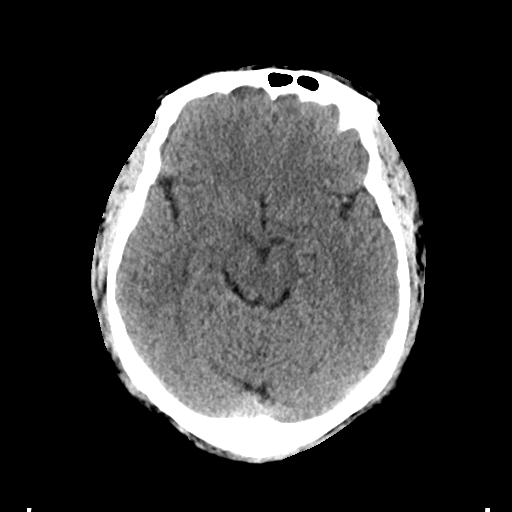
[im 17/34  brain]
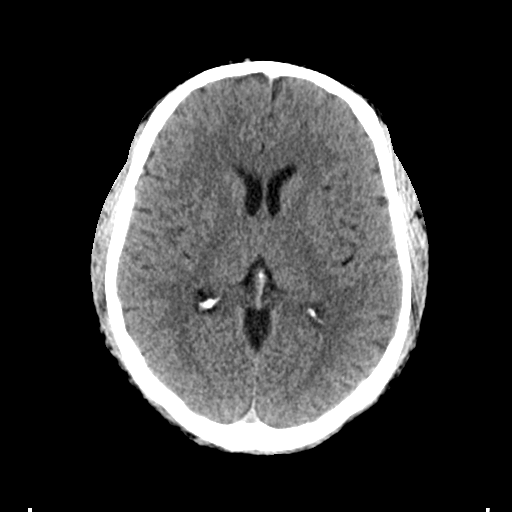
[im 21/34  brain]
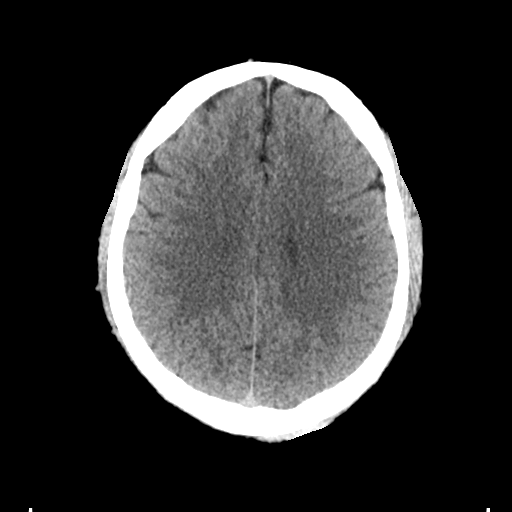
[im 21/34  bone]
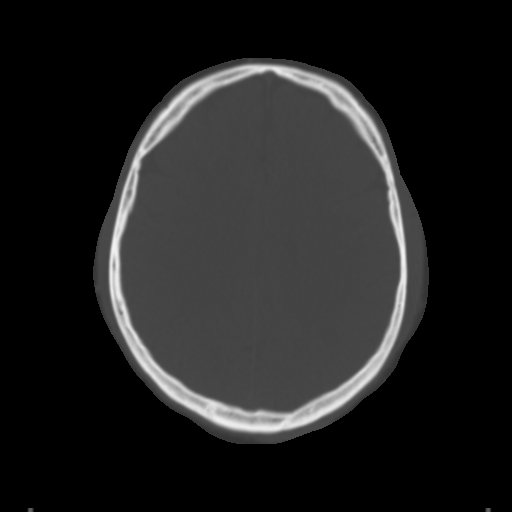
[im 25/34  brain]
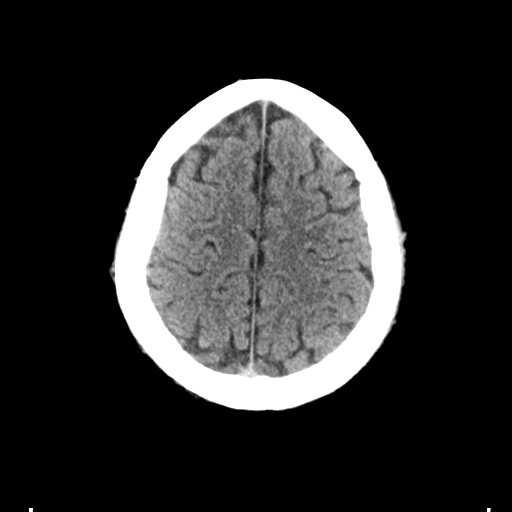
[im 29/34  brain]
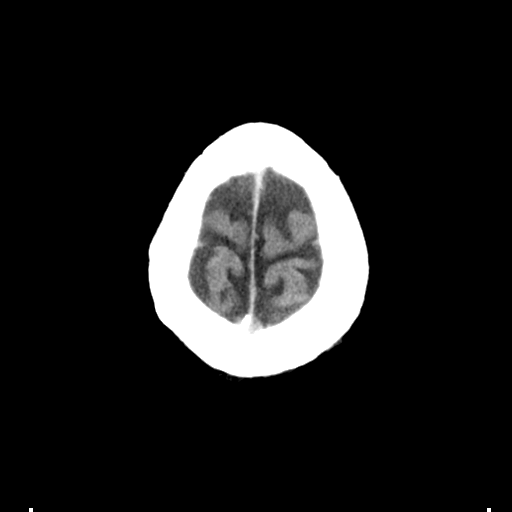

[Series 4: head bone · axial · 0.49mm/px · z∈[-128,-94]mm · 3 of 85 slices shown]
[im 9/85  bone]
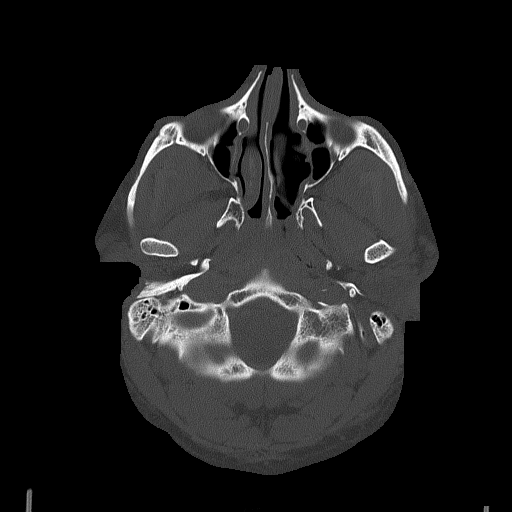
[im 17/85  bone]
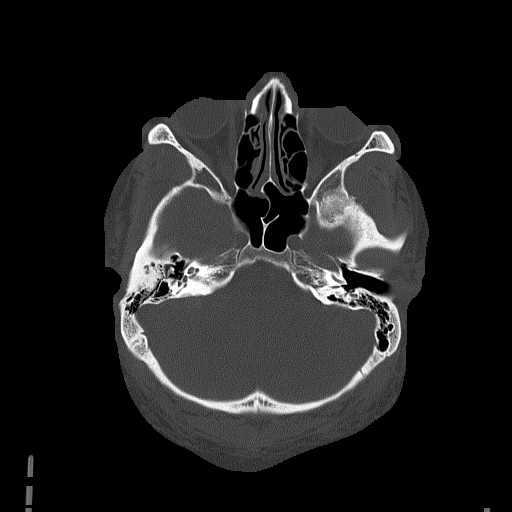
[im 26/85  bone]
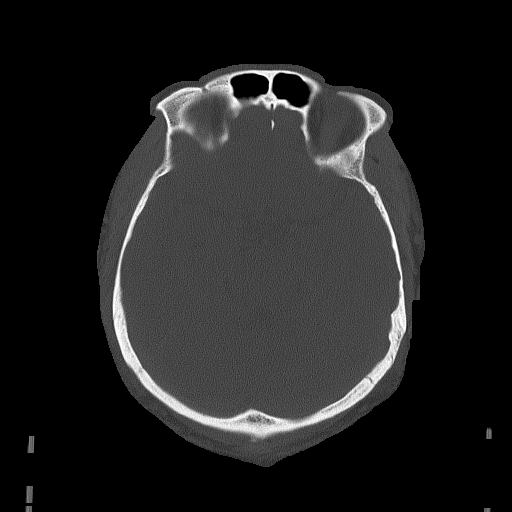

[Series 5: head without cor · coronal · non-contrast · 0.33mm/px · 3 of 73 slices shown]
[im 27/73  brain]
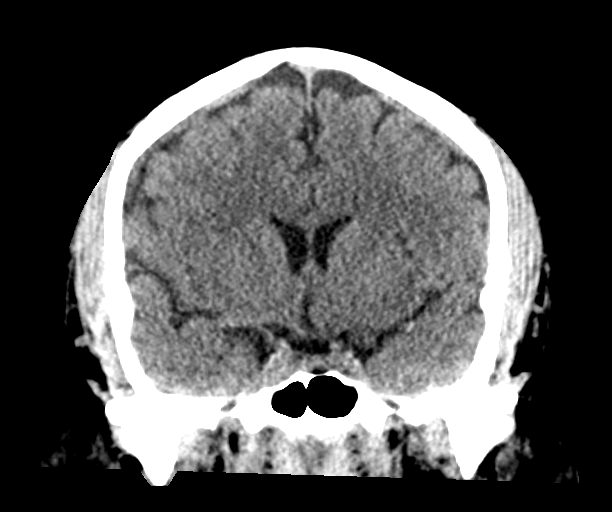
[im 33/73  brain]
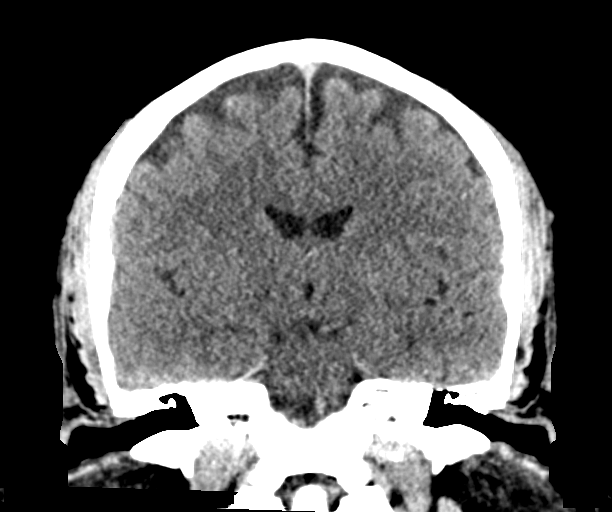
[im 40/73  brain]
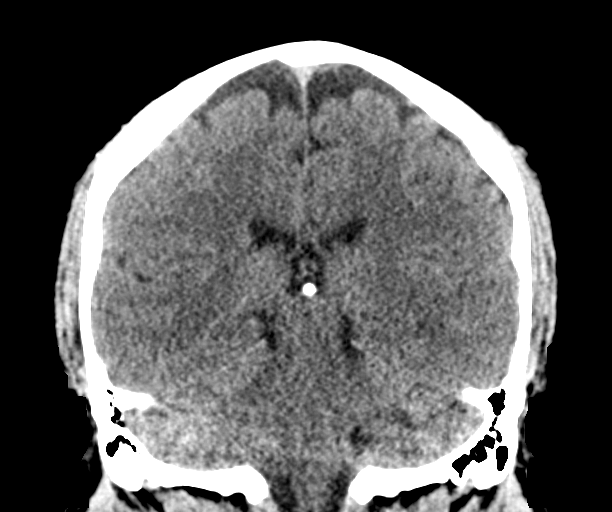

[Series 6: head without sag · sagittal · non-contrast · 0.33mm/px · 3 of 67 slices shown]
[im 23/67  brain]
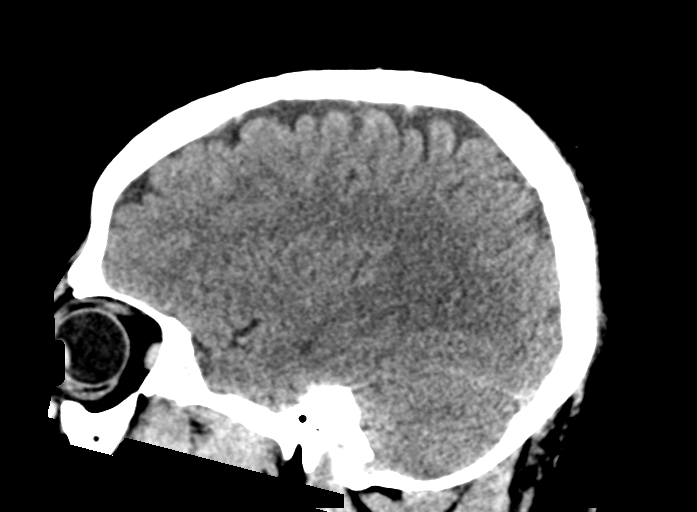
[im 34/67  brain]
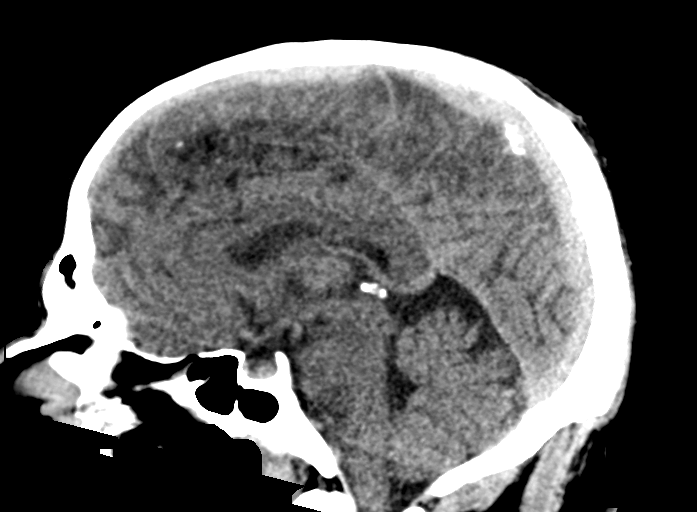
[im 45/67  brain]
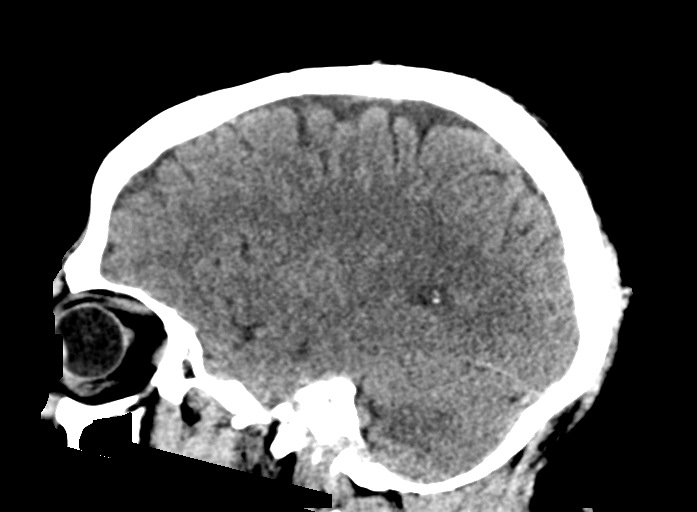

[16 of 47 positions shown; findings below may reference images not displayed]

FINDINGS: Brain: No midline shift, ventriculomegaly, mass effect, evidence of
mass lesion, intracranial hemorrhage or evidence of cortically based
acute infarction. Gray-white matter differentiation is within normal
limits throughout the brain. Normal cerebral volume.

Vascular: No suspicious intracranial vascular hyperdensity.

Skull: Negative.

Sinuses/Orbits: Minimal paranasal sinus mucosal thickening. The
visible tympanic cavities and mastoids are clear.

Other: Large body habitus. Otherwise negative orbit and scalp soft
tissues.
IMPRESSION: Negative noncontrast Head CT.

## 2021-03-31 ENCOUNTER — Ambulatory Visit: Payer: Self-pay

## 2022-03-28 ENCOUNTER — Other Ambulatory Visit (HOSPITAL_BASED_OUTPATIENT_CLINIC_OR_DEPARTMENT_OTHER): Payer: Self-pay | Admitting: Internal Medicine

## 2022-03-28 DIAGNOSIS — Z87898 Personal history of other specified conditions: Secondary | ICD-10-CM

## 2022-04-02 ENCOUNTER — Ambulatory Visit (HOSPITAL_BASED_OUTPATIENT_CLINIC_OR_DEPARTMENT_OTHER): Admission: RE | Admit: 2022-04-02 | Payer: Commercial Managed Care - HMO | Source: Ambulatory Visit

## 2022-04-16 ENCOUNTER — Ambulatory Visit (HOSPITAL_BASED_OUTPATIENT_CLINIC_OR_DEPARTMENT_OTHER): Admission: RE | Admit: 2022-04-16 | Payer: Commercial Managed Care - HMO | Source: Ambulatory Visit

## 2022-04-19 ENCOUNTER — Ambulatory Visit (INDEPENDENT_AMBULATORY_CARE_PROVIDER_SITE_OTHER): Payer: Self-pay | Admitting: Bariatrics

## 2022-04-20 ENCOUNTER — Ambulatory Visit: Payer: Commercial Managed Care - HMO | Admitting: Podiatry

## 2022-04-27 ENCOUNTER — Encounter (INDEPENDENT_AMBULATORY_CARE_PROVIDER_SITE_OTHER): Payer: Self-pay

## 2022-05-03 ENCOUNTER — Ambulatory Visit (INDEPENDENT_AMBULATORY_CARE_PROVIDER_SITE_OTHER): Payer: 59 | Admitting: Bariatrics

## 2022-09-30 ENCOUNTER — Emergency Department (HOSPITAL_COMMUNITY)
Admission: EM | Admit: 2022-09-30 | Discharge: 2022-09-30 | Discharge status: Against Medical Advice | Payer: Commercial Managed Care - HMO | Attending: Emergency Medicine | Admitting: Emergency Medicine

## 2022-09-30 DIAGNOSIS — Z5321 Procedure and treatment not carried out due to patient leaving prior to being seen by health care provider: Secondary | ICD-10-CM | POA: Diagnosis present

## 2022-09-30 NOTE — ED Notes (Signed)
Pt LWBS due to wait

## 2022-12-21 DIAGNOSIS — Z136 Encounter for screening for cardiovascular disorders: Secondary | ICD-10-CM | POA: Diagnosis not present

## 2022-12-21 DIAGNOSIS — J4520 Mild intermittent asthma, uncomplicated: Secondary | ICD-10-CM | POA: Diagnosis not present

## 2022-12-21 DIAGNOSIS — Z0001 Encounter for general adult medical examination with abnormal findings: Secondary | ICD-10-CM | POA: Diagnosis not present

## 2022-12-21 DIAGNOSIS — Z131 Encounter for screening for diabetes mellitus: Secondary | ICD-10-CM | POA: Diagnosis not present

## 2022-12-21 DIAGNOSIS — E781 Pure hyperglyceridemia: Secondary | ICD-10-CM | POA: Diagnosis not present

## 2022-12-21 DIAGNOSIS — R7303 Prediabetes: Secondary | ICD-10-CM | POA: Diagnosis not present

## 2022-12-21 DIAGNOSIS — Z1329 Encounter for screening for other suspected endocrine disorder: Secondary | ICD-10-CM | POA: Diagnosis not present

## 2022-12-21 DIAGNOSIS — I10 Essential (primary) hypertension: Secondary | ICD-10-CM | POA: Diagnosis not present

## 2024-04-16 ENCOUNTER — Ambulatory Visit: Admitting: Podiatry
